# Patient Record
Sex: Female | Born: 1950 | Race: White | Hispanic: Yes | Marital: Married
Health system: Southern US, Community
[De-identification: ages and names within clinical notes are randomized; demographics above are authoritative.]

---

## 2020-01-04 ENCOUNTER — Emergency Department (HOSPITAL_COMMUNITY): Payer: 59

## 2020-01-04 ENCOUNTER — Inpatient Hospital Stay (HOSPITAL_COMMUNITY)
Admission: EM | Admit: 2020-01-04 | Discharge: 2020-01-17 | DRG: 813 | Disposition: E | Payer: 59 | Attending: Pulmonary Disease | Admitting: Pulmonary Disease

## 2020-01-04 DIAGNOSIS — E872 Acidosis: Secondary | ICD-10-CM | POA: Diagnosis present

## 2020-01-04 DIAGNOSIS — R64 Cachexia: Secondary | ICD-10-CM | POA: Diagnosis present

## 2020-01-04 DIAGNOSIS — N179 Acute kidney failure, unspecified: Secondary | ICD-10-CM | POA: Diagnosis present

## 2020-01-04 DIAGNOSIS — I468 Cardiac arrest due to other underlying condition: Secondary | ICD-10-CM | POA: Diagnosis present

## 2020-01-04 DIAGNOSIS — J9601 Acute respiratory failure with hypoxia: Secondary | ICD-10-CM | POA: Diagnosis present

## 2020-01-04 DIAGNOSIS — K7200 Acute and subacute hepatic failure without coma: Secondary | ICD-10-CM | POA: Diagnosis present

## 2020-01-04 DIAGNOSIS — I248 Other forms of acute ischemic heart disease: Secondary | ICD-10-CM | POA: Diagnosis present

## 2020-01-04 DIAGNOSIS — Z66 Do not resuscitate: Secondary | ICD-10-CM | POA: Diagnosis not present

## 2020-01-04 DIAGNOSIS — I509 Heart failure, unspecified: Secondary | ICD-10-CM | POA: Diagnosis present

## 2020-01-04 DIAGNOSIS — J9600 Acute respiratory failure, unspecified whether with hypoxia or hypercapnia: Secondary | ICD-10-CM

## 2020-01-04 DIAGNOSIS — I469 Cardiac arrest, cause unspecified: Secondary | ICD-10-CM | POA: Diagnosis present

## 2020-01-04 DIAGNOSIS — Z681 Body mass index (BMI) 19 or less, adult: Secondary | ICD-10-CM | POA: Diagnosis not present

## 2020-01-04 DIAGNOSIS — R634 Abnormal weight loss: Secondary | ICD-10-CM | POA: Diagnosis present

## 2020-01-04 DIAGNOSIS — I429 Cardiomyopathy, unspecified: Secondary | ICD-10-CM | POA: Diagnosis present

## 2020-01-04 DIAGNOSIS — R0489 Hemorrhage from other sites in respiratory passages: Secondary | ICD-10-CM | POA: Diagnosis present

## 2020-01-04 DIAGNOSIS — Z20822 Contact with and (suspected) exposure to covid-19: Secondary | ICD-10-CM | POA: Diagnosis present

## 2020-01-04 DIAGNOSIS — K920 Hematemesis: Secondary | ICD-10-CM | POA: Diagnosis present

## 2020-01-04 DIAGNOSIS — D62 Acute posthemorrhagic anemia: Secondary | ICD-10-CM | POA: Diagnosis present

## 2020-01-04 DIAGNOSIS — K922 Gastrointestinal hemorrhage, unspecified: Secondary | ICD-10-CM | POA: Diagnosis present

## 2020-01-04 DIAGNOSIS — K921 Melena: Secondary | ICD-10-CM | POA: Diagnosis present

## 2020-01-04 DIAGNOSIS — D61818 Other pancytopenia: Secondary | ICD-10-CM | POA: Diagnosis present

## 2020-01-04 DIAGNOSIS — R578 Other shock: Secondary | ICD-10-CM | POA: Diagnosis present

## 2020-01-04 DIAGNOSIS — D65 Disseminated intravascular coagulation [defibrination syndrome]: Secondary | ICD-10-CM | POA: Diagnosis present

## 2020-01-04 DIAGNOSIS — Z515 Encounter for palliative care: Secondary | ICD-10-CM | POA: Diagnosis not present

## 2020-01-04 DIAGNOSIS — D696 Thrombocytopenia, unspecified: Secondary | ICD-10-CM | POA: Diagnosis present

## 2020-01-04 DIAGNOSIS — B20 Human immunodeficiency virus [HIV] disease: Secondary | ICD-10-CM | POA: Diagnosis present

## 2020-01-04 DIAGNOSIS — Z9114 Patient's other noncompliance with medication regimen: Secondary | ICD-10-CM | POA: Diagnosis not present

## 2020-01-04 DIAGNOSIS — E875 Hyperkalemia: Secondary | ICD-10-CM | POA: Diagnosis present

## 2020-01-04 DIAGNOSIS — R579 Shock, unspecified: Secondary | ICD-10-CM

## 2020-01-04 DIAGNOSIS — R680 Hypothermia, not associated with low environmental temperature: Secondary | ICD-10-CM | POA: Diagnosis present

## 2020-01-04 LAB — BPAM PLATELET PHERESIS
Blood Product Expiration Date: 202104192359
ISSUE DATE / TIME: 202104180941
Unit Type and Rh: 6200

## 2020-01-04 LAB — RAPID URINE DRUG SCREEN, HOSP PERFORMED
Amphetamines: NOT DETECTED
Barbiturates: NOT DETECTED
Benzodiazepines: NOT DETECTED
Cocaine: NOT DETECTED
Opiates: NOT DETECTED
Tetrahydrocannabinol: NOT DETECTED

## 2020-01-04 LAB — COMPREHENSIVE METABOLIC PANEL
ALT: 220 U/L — ABNORMAL HIGH (ref 0–44)
AST: 358 U/L — ABNORMAL HIGH (ref 15–41)
Albumin: 1.5 g/dL — ABNORMAL LOW (ref 3.5–5.0)
Alkaline Phosphatase: 35 U/L — ABNORMAL LOW (ref 38–126)
Anion gap: 18 — ABNORMAL HIGH (ref 5–15)
BUN: 33 mg/dL — ABNORMAL HIGH (ref 8–23)
CO2: 8 mmol/L — ABNORMAL LOW (ref 22–32)
Calcium: 6.9 mg/dL — ABNORMAL LOW (ref 8.9–10.3)
Chloride: 110 mmol/L (ref 98–111)
Creatinine, Ser: 1.41 mg/dL — ABNORMAL HIGH (ref 0.44–1.00)
GFR calc Af Amer: 44 mL/min — ABNORMAL LOW (ref >60)
GFR calc non Af Amer: 38 mL/min — ABNORMAL LOW (ref >60)
Glucose, Bld: 307 mg/dL — ABNORMAL HIGH (ref 70–99)
Potassium: 5.3 mmol/L — ABNORMAL HIGH (ref 3.5–5.1)
Sodium: 136 mmol/L (ref 135–145)
Total Bilirubin: 0.6 mg/dL (ref 0.3–1.2)
Total Protein: 3.3 g/dL — ABNORMAL LOW (ref 6.5–8.1)

## 2020-01-04 LAB — CBC WITH DIFFERENTIAL/PLATELET
Abs Immature Granulocytes: 0.08 10*3/uL — ABNORMAL HIGH (ref 0.00–0.07)
Basophils Absolute: 0 10*3/uL (ref 0.0–0.1)
Basophils Relative: 0 %
Eosinophils Absolute: 0 10*3/uL (ref 0.0–0.5)
Eosinophils Relative: 0 %
HCT: 12 % — ABNORMAL LOW (ref 36.0–46.0)
Hemoglobin: 3.4 g/dL — CL (ref 12.0–15.0)
Immature Granulocytes: 3 %
Lymphocytes Relative: 71 %
Lymphs Abs: 1.7 10*3/uL (ref 0.7–4.0)
MCH: 27 pg (ref 26.0–34.0)
MCHC: 28.3 g/dL — ABNORMAL LOW (ref 30.0–36.0)
MCV: 95.2 fL (ref 80.0–100.0)
Monocytes Absolute: 0.1 10*3/uL (ref 0.1–1.0)
Monocytes Relative: 3 %
Neutro Abs: 0.5 10*3/uL — ABNORMAL LOW (ref 1.7–7.7)
Neutrophils Relative %: 23 %
Platelets: 12 10*3/uL — CL (ref 150–400)
RBC: 1.26 MIL/uL — ABNORMAL LOW (ref 3.87–5.11)
RDW: 16.8 % — ABNORMAL HIGH (ref 11.5–15.5)
WBC: 2.4 10*3/uL — ABNORMAL LOW (ref 4.0–10.5)
nRBC: 17.4 % — ABNORMAL HIGH (ref 0.0–0.2)

## 2020-01-04 LAB — URINALYSIS, ROUTINE W REFLEX MICROSCOPIC
Bacteria, UA: NONE SEEN
Bilirubin Urine: NEGATIVE
Glucose, UA: NEGATIVE mg/dL
Hgb urine dipstick: NEGATIVE
Ketones, ur: NEGATIVE mg/dL
Leukocytes,Ua: NEGATIVE
Nitrite: NEGATIVE
Protein, ur: NEGATIVE mg/dL
Specific Gravity, Urine: 1.02 (ref 1.005–1.030)
pH: 5 (ref 5.0–8.0)

## 2020-01-04 LAB — PREPARE PLATELET PHERESIS: Unit division: 0

## 2020-01-04 LAB — RESPIRATORY PANEL BY RT PCR (FLU A&B, COVID)
Influenza A by PCR: NEGATIVE
Influenza B by PCR: NEGATIVE
SARS Coronavirus 2 by RT PCR: NEGATIVE

## 2020-01-04 LAB — CBG MONITORING, ED: Glucose-Capillary: 264 mg/dL — ABNORMAL HIGH (ref 70–99)

## 2020-01-04 LAB — PROTIME-INR
INR: 3.5 — ABNORMAL HIGH (ref 0.8–1.2)
Prothrombin Time: 35.3 seconds — ABNORMAL HIGH (ref 11.4–15.2)

## 2020-01-04 LAB — POC OCCULT BLOOD, ED: Fecal Occult Bld: POSITIVE — AB

## 2020-01-04 LAB — MASSIVE TRANSFUSION PROTOCOL ORDER (BLOOD BANK NOTIFICATION)

## 2020-01-04 LAB — BRAIN NATRIURETIC PEPTIDE: B Natriuretic Peptide: 1453.4 pg/mL — ABNORMAL HIGH (ref 0.0–100.0)

## 2020-01-04 LAB — PREPARE RBC (CROSSMATCH)

## 2020-01-04 LAB — LACTIC ACID, PLASMA: Lactic Acid, Venous: >11 mmol/L (ref 0.5–1.9)

## 2020-01-04 LAB — TROPONIN I (HIGH SENSITIVITY)
Troponin I (High Sensitivity): 4723 ng/L (ref <18)
Troponin I (High Sensitivity): 5871 ng/L (ref <18)

## 2020-01-04 LAB — ABO/RH: ABO/RH(D): O POS

## 2020-01-04 MED ORDER — FENTANYL CITRATE (PF) 100 MCG/2ML IJ SOLN: 50.0000 ug | INTRAMUSCULAR | Status: DC | PRN

## 2020-01-04 MED ORDER — DOCUSATE SODIUM 100 MG PO CAPS: 100.0000 mg | ORAL_CAPSULE | Freq: Two times a day (BID) | ORAL | Status: DC | PRN

## 2020-01-04 MED ORDER — PANTOPRAZOLE SODIUM 40 MG IV SOLR: 40.0000 mg | Freq: Two times a day (BID) | INTRAVENOUS | Status: DC

## 2020-01-04 MED ORDER — LACTATED RINGERS IV SOLN: INTRAVENOUS | Status: DC

## 2020-01-04 MED ORDER — LACTATED RINGERS IV BOLUS: 1000.0000 mL | Freq: Once | INTRAVENOUS | Status: DC

## 2020-01-04 MED ORDER — SODIUM CHLORIDE 0.9 % IV SOLN: 10.0000 mL/h | Freq: Once | INTRAVENOUS | Status: DC

## 2020-01-04 MED ORDER — SODIUM CHLORIDE 0.9% IV SOLUTION: Freq: Once | INTRAVENOUS | Status: DC

## 2020-01-04 MED ORDER — MORPHINE 100MG IN NS 100ML (1MG/ML) PREMIX INFUSION
0.0000 mg/h | INTRAVENOUS | Status: DC
  Administered 2020-01-04: 1 mg/h via INTRAVENOUS
  Filled 2020-01-04: qty 100

## 2020-01-04 MED ORDER — NOREPINEPHRINE 4 MG/250ML-% IV SOLN: 0.0000 ug/min | INTRAVENOUS | Status: DC

## 2020-01-04 MED ORDER — POLYETHYLENE GLYCOL 3350 17 G PO PACK: 17.0000 g | PACK | Freq: Every day | ORAL | Status: DC | PRN

## 2020-01-04 MED ORDER — VITAMIN K1 10 MG/ML IJ SOLN
10.0000 mg | Freq: Once | INTRAVENOUS | Status: DC
  Filled 2020-01-04: qty 1

## 2020-01-04 MED ORDER — NOREPINEPHRINE 4 MG/250ML-% IV SOLN
2.0000 ug/min | INTRAVENOUS | Status: DC
  Administered 2020-01-04: 08:00:00 10 ug/min via INTRAVENOUS
  Filled 2020-01-04: qty 250

## 2020-01-04 MED ORDER — FENTANYL CITRATE (PF) 100 MCG/2ML IJ SOLN: 25.0000 ug | INTRAMUSCULAR | Status: DC | PRN

## 2020-01-04 MED ORDER — SODIUM CHLORIDE 0.9 % IV SOLN
250.0000 mL | INTRAVENOUS | Status: DC
  Administered 2020-01-04: 08:00:00 250 mL via INTRAVENOUS

## 2020-01-04 MED ORDER — MIDAZOLAM HCL 2 MG/2ML IJ SOLN: 1.0000 mg | INTRAMUSCULAR | Status: DC | PRN

## 2020-01-05 LAB — BPAM RBC
Blood Product Expiration Date: 202105082359
Blood Product Expiration Date: 202105132359
Blood Product Expiration Date: 202105132359
Blood Product Expiration Date: 202105132359
Blood Product Expiration Date: 202105142359
Blood Product Expiration Date: 202105142359
ISSUE DATE / TIME: 202104180743
ISSUE DATE / TIME: 202104180905
ISSUE DATE / TIME: 202104180941
ISSUE DATE / TIME: 202104180941
ISSUE DATE / TIME: 202104180948
ISSUE DATE / TIME: 202104180948
Unit Type and Rh: 5100
Unit Type and Rh: 5100
Unit Type and Rh: 5100
Unit Type and Rh: 5100
Unit Type and Rh: 5100
Unit Type and Rh: 5100

## 2020-01-05 LAB — TYPE AND SCREEN
ABO/RH(D): O POS
Antibody Screen: NEGATIVE
Unit division: 0
Unit division: 0
Unit division: 0
Unit division: 0
Unit division: 0
Unit division: 0

## 2020-01-05 LAB — PREPARE FRESH FROZEN PLASMA
Unit division: 0
Unit division: 0
Unit division: 0
Unit division: 0
Unit division: 0

## 2020-01-05 LAB — BPAM FFP
Blood Product Expiration Date: 202104182359
Blood Product Expiration Date: 202104232359
Blood Product Expiration Date: 202104232359
Blood Product Expiration Date: 202104232359
Blood Product Expiration Date: 202104232359
Blood Product Expiration Date: 202105012359
Blood Product Expiration Date: 202105012359
Blood Product Expiration Date: 202105012359
ISSUE DATE / TIME: 202104180938
ISSUE DATE / TIME: 202104180938
ISSUE DATE / TIME: 202104180948
ISSUE DATE / TIME: 202104180948
Unit Type and Rh: 5100
Unit Type and Rh: 5100
Unit Type and Rh: 5100
Unit Type and Rh: 6200
Unit Type and Rh: 6200
Unit Type and Rh: 6200
Unit Type and Rh: 6200
Unit Type and Rh: 6200

## 2020-01-05 LAB — T-HELPER CELLS (CD4) COUNT (NOT AT ARMC)
CD4 % Helper T Cell: 7 % — ABNORMAL LOW (ref 33–65)
CD4 T Cell Abs: 119 /uL — ABNORMAL LOW (ref 400–1790)

## 2020-01-05 LAB — URINE CULTURE: Culture: NO GROWTH

## 2020-01-05 LAB — PATHOLOGIST SMEAR REVIEW

## 2020-01-07 DIAGNOSIS — R0489 Hemorrhage from other sites in respiratory passages: Secondary | ICD-10-CM | POA: Diagnosis present

## 2020-01-07 DIAGNOSIS — B20 Human immunodeficiency virus [HIV] disease: Secondary | ICD-10-CM | POA: Diagnosis present

## 2020-01-07 DIAGNOSIS — J9601 Acute respiratory failure with hypoxia: Secondary | ICD-10-CM | POA: Diagnosis present

## 2020-01-07 DIAGNOSIS — D696 Thrombocytopenia, unspecified: Secondary | ICD-10-CM | POA: Diagnosis present

## 2020-01-07 DIAGNOSIS — K922 Gastrointestinal hemorrhage, unspecified: Secondary | ICD-10-CM | POA: Diagnosis present

## 2020-01-07 DIAGNOSIS — N179 Acute kidney failure, unspecified: Secondary | ICD-10-CM | POA: Diagnosis present

## 2020-01-07 DIAGNOSIS — D65 Disseminated intravascular coagulation [defibrination syndrome]: Secondary | ICD-10-CM | POA: Diagnosis present

## 2020-01-17 NOTE — Progress Notes (Signed)
75 mL morphine drip (1 mg per mL) wasted with Allene Pyo, RN.

## 2020-01-17 NOTE — Progress Notes (Signed)
Chaplain responded to page from nurse. Patient deceased.  Chaplain offered support and prayer for patient, and  patient's daughter, Heather Schroeder.  Chaplain conveyed Patient Placement Card. No funeral home yet chosen.  [facility].  Heather Schroeder, [address], [place], [place]  [address]  [phone]  Chaplain escorted daughter to door. Rev. Lynnell Chad [phone]

## 2020-01-17 NOTE — Progress Notes (Signed)
PCCM attending attestation:  Full H&P to follow.  By resident housestaff.  This is a 69 year old female recently relocated from [place].  Patient found at home after complaining of abdominal pain and cardiac arrest.  EMS performed CPR for approximately 15 minutes in PEA.  Patient found to have an undetectable low hemoglobin.  Undergoing acute upper GI bleeding.  Patient was intubated on mechanical life support profuse amount of bleeding.  Unfortunately with ongoing severe coagulopathy.  Initial hemoglobin of 3.4 platelets of 12 INR of 3.5.  Of note past medical history of HIV stopped taking medications 2 years ago with greater than 70 pound weight loss.  Also history of cardiomyopathy.  Patient severely cachectic with ongoing hemorrhagic shock hypothermia core body temperature of less than 92.  Now in multiorgan failure and what appears to be DIC.  She is bleeding from almost every orifice.  She appears to have diffuse spontaneous alveolar hemorrhage with significant amount of blood returning from ET tube.  BP (!) 115/95   Pulse (!) 106   Temp (!) 92.9 F (33.8 C)   Resp (!) 22   Ht 5\' 4"  (1.626 m)   Wt 49.9 kg   SpO2 100%   BMI 18.88 kg/m   General: Elderly chronically ill-appearing female intubated on mechanical life support, cyanotic Heart: Tachycardic, regular, S1-S2 Lungs: Diffuse severe rhonchi agonal respirations on vent Abdomen: distended Extremities: Cold, cyanotic Skin: Mottled  Labs: Reviewed Chest x-ray: Reviewed  Assessment: Acute hemorrhagic shock Consumptive coagulopathy Hypothermia, acidosis Pulmonary hemorrhage Presumed DIC Post cardiac arrest Hyperkalemia Acute renal failure Elevated troponin likely related to demand ischemia Severe lactic acidosis Hypocalcemia HIV, noncompliant w/ meds  70lbs weightloss   Plan: Long discussion with patient's daughter at bedside. She states that "mom never would have wanted this" At this point I believe the patient is  actively dying. Her vital signs are not compatible with life.  O2 sats in the 50s on vent support. We discussed appropriate next measures with patient's daughter. We will plan to admit to the intensive care unit we have the patient on mechanical support with OG tube as she is having profuse amounts of bleeding from the upper airway and stomach. We will plan to transition to full comfort measures. Starting patient on morphine infusion, as needed Versed Continue IV fluids No additional blood products. Patient's CODE STATUS changed to full DNR. Currently on vasopressors that we will need to titrate off.  This patient is critically ill with multiple organ system failure; which, requires frequent high complexity decision making, assessment, support, evaluation, and titration of therapies. This was completed through the application of advanced monitoring technologies and extensive interpretation of multiple databases. During this encounter critical care time was devoted to patient care services described in this note for 42 minutes.   , DO [facility] 08-Jan-2020 10:37 AM

## 2020-01-17 NOTE — Progress Notes (Signed)
Patient asystole on monitor. No audible heart tones. No breath sounds. No palpable.   Time of death confirmed at 1543 by Allene Pyo, RN and Delanna Ahmadi, RN.   Chaplin services offered. No funeral home arrangements known. Family provided with information when funeral home decided.  Hand prints and poems given to family.

## 2020-01-17 NOTE — Plan of Care (Signed)
  Problem: Clinical Measurements: Goal: Respiratory complications will improve Outcome: Not Progressing Note: Pt on ventilator requiring 100% FiO2.   Goal: Cardiovascular complication will be avoided Outcome: Not Progressing Note: Pt post arrest, HR in low 100's, BP steadily decreasing

## 2020-01-17 NOTE — ED Triage Notes (Addendum)
Pt in from home, found down by family, CPR started by family, in via [facility] as post-CPR with 15 min CPR PTA. Initial rhythm asystole, 2 epi's given, then PEA, then pulses at 0626. Palpated pressure of 80 en route, airway in place, 16G to [facility]

## 2020-01-17 NOTE — H&P (Addendum)
NAME:  Heather Schroeder, MRN:  [id number], DOB:  13-Jul-1951, LOS: 0 ADMISSION DATE:  01-30-20, CONSULTATION DATE:  01-30-2020 REFERRING MD:  Dr. Regenia Skeeter, CHIEF COMPLAINT:  Cardiac arrest   Brief History   69 year old female with a medical history of uncontrolled HIV and CHF who presented to the ED post cardiac arrest currently admitted for hemorrhagic shock with multi-organ failure.   History of present illness   69 year old female with a medical history of uncontrolled HIV and CHF who presented to the ED post cardiac arrest. Patient lives in [place] and moved to [place] on Friday. Per daughter, she has been complaining of abdominal pain x 2 days. This has been associated with coffee-ground emesis. They have not noticed any hematochezia or melena. This morning patient was feeling weak. She went to lay in the couch, had an episode of urinary incontinence and then sopped breathing. Daughter called 71 and CPR was started right away, though she does not think she was doing it correctly. When EMS arrived patient was pulseless and in asystole. CPR performed x 15-20 minutes. She received 2 epis and was intubated prior to ROSC.   Per daughter, patient has not been taking care of herself. She stopped taking her HIV medications 2 yrs ago and has had a 70 lbs weight loss in the past 6 months.  She was recently in the hospital in [place]. Daughter is not sure why, but states there were abnormalities in her chest imaging and she underwent testing for TB, which was negative. No records available at this time. At that time, she was also found to have a clot in her heart and she was discharged on Eliquis.   In the ED she was hypothermic 96.1, HR 86, BP 69/53 and O2 sat 100 % on the vent (FiO2 50, PEEP 5, TV 420 and RR 18). Her bloodwork was remarkable for WBC 2.4, Hgb 3.4, Plt 12, K 5.3, bicarb 8, glucose 80, BUN/Cr 33/1.41, AST 358, ALT pending   Past Medical History  HIV - uncontrolled, off of meds x 2 yrs  CHF - unclear  if diastolic or systolic  Cardiac thrombus   Significant Hospital Events   4/18 Admitted to the ICU, intubated   Consults:  None  Procedures:  4/18 ETT, femoral line   Significant Diagnostic Tests:  4/18 CXR   Micro Data:  4/18 Ucx 4/18 Bcx   Antimicrobials:  None  Interim history/subjective:  Please see above.   Objective   Blood pressure (!) 82/56, pulse 99, temperature (!) 96.1 F (35.6 C), temperature source Temporal, resp. rate 19, height 5\' 4"  (1.626 m), weight 49.9 kg, SpO2 100 %.    Vent Mode: PRVC FiO2 (%):  [100 %] 100 % Set Rate:  [18 bmp] 18 bmp Vt Set:  [430 mL] 430 mL PEEP:  [5 cmH20] 5 cmH20 Plateau Pressure:  [4 cmH20-12 cmH20] 12 cmH20   Intake/Output Summary (Last 24 hours) at 01/30/20 0911 Last data filed at 01/30/20 0835 Gross per 24 hour  Intake 335 ml  Output --  Net 335 ml   Filed Weights   2020/01/30 0821  Weight: 49.9 kg   Physical Exam Vitals and nursing note reviewed.  Constitutional:      General: She is in acute distress.     Appearance: She is ill-appearing and toxic-appearing.  HENT:     Head: Normocephalic and atraumatic.     Mouth/Throat:     Comments: Blood noted in oropharynx  Eyes:     General: No scleral icterus.    Pupils:     Right eye: Pupil is not reactive.     Left eye: Pupil is not reactive.  Cardiovascular:     Rate and Rhythm: Regular rhythm. Tachycardia present.  Pulmonary:     Breath sounds: Rhonchi present.  Abdominal:     General: Bowel sounds are normal. There is no distension.     Palpations: Abdomen is soft.     Tenderness: There is no abdominal tenderness. There is no guarding.  Musculoskeletal:        General: No deformity.     Right lower leg: No edema.     Left lower leg: No edema.  Skin:    General: Skin is cool and dry.  Neurological:     Comments: Sedated and unresponsive on vent    On re-examination, oxygen saturation 50% on ventilator. She is having markedly bright red blood per  rectum. Bright red blood is also noted to be in her ETT and coming out of oropharynx.   Resolved Hospital Problem list   None   Assessment & Plan:   ** After discussion with daughter at bedside and another daughter via telephone, it was decided to transition patient to DNR and de-escalate interventions. Will try to extend life until tonight when additional family members arrive into town.   # s/p Cardiac arrest Likely due to profound anemia. Initially asystole with conversion to PEA, received 2 doses of Epi, no shocks. Initial troponin is expectedly elevated in the 4000s.   # Shock with multi-organ failure, hemorrhagic # Upper GI Bleed Significant GI bleed with profound hematemesis and melena on examination. S/p 4 units of pRBCs, 1 unit of platelets, 1 unit of FFP. Will stop further transfusions and sustain intravascular volume with LR as best as possible.   Prognosis is grime given patient's coagulopathy and hypothermia, making bleed difficult to control. At this point, she is bleeding faster than it can be replenished. She is additionally developed pulmonary hemorrhaging, as evidence by blood in the ETT.   - Titrate Levophed to maintain MAP > 65  - LR bolus followed by 150 cc/hr  # Pancytopenia  Profound with normocytic anemia with hemoglobin of 3.4 on arrival, in addition to thrombocytopenia with platelets of 12. Leukopenia is likely chronic in light of HIV status. However, anemia and thrombocytopenia is acute. Anemia secondary to GI bleed. There may be an aspect of hemolysis versus DIC affecting platelets given her diffuse hemorrhaging and elevated INR.   # Acute respiratory failure  - Continue PRVC @ 8 cc/kg   # Acute renal failure  # Hyperkalemia  Secondary to shock  # Acute liver failure  Secondary to shock  - s/p Vitamin K 10 mg  # HIV Uncontrolled as patient has not taken medications in 2 years. No other history available at this time.   # Unintentional Weight loss 70  pounds over the last 6 months. Likely malignancy. Unknown primary at this time. All work up has been in [place] and records not available.   Best practice:  Diet: NPO Pain/Anxiety/Delirium protocol (if indicated): Morphine gtt, Versed PRN, Fentanyl PRn VAP protocol (if indicated): Ordered DVT prophylaxis: Not ordered, active GI bleed GI prophylaxis: Protonix BID Glucose control: None  Mobility: Bedbound Code Status: DNR Family Communication: Updated daughter at bedside Disposition: ICU  Labs   CBC: Recent Labs  Lab January 10, 2020 0718  WBC 2.4*  NEUTROABS PENDING  HGB 3.4*  HCT  12.0*  MCV 95.2  PLT PENDING    Basic Metabolic Panel: Recent Labs  Lab 01-10-20 0718  NA 136  K 5.3*  CL 110  CO2 8*  GLUCOSE 307*  BUN 33*  CREATININE 1.41*  CALCIUM 6.9*   GFR: Estimated Creatinine Clearance: 30.1 mL/min (A) (by C-G formula based on SCr of 1.41 mg/dL (H)). Recent Labs  Lab 01/10/2020 0718  WBC 2.4*  LATICACIDVEN >11.0*    Liver Function Tests: Recent Labs  Lab 01/10/20 0718  AST 358*  ALT PENDING  ALKPHOS 35*  BILITOT 0.6  PROT 3.3*  ALBUMIN 1.5*   No results for input(s): LIPASE, AMYLASE in the last 168 hours. No results for input(s): AMMONIA in the last 168 hours.  ABG No results found for: PHART, PCO2ART, PO2ART, HCO3, TCO2, ACIDBASEDEF, O2SAT   Coagulation Profile: Recent Labs  Lab 01-10-20 0718  INR 3.5*    Cardiac Enzymes: No results for input(s): CKTOTAL, CKMB, CKMBINDEX, TROPONINI in the last 168 hours.  HbA1C: No results found for: HGBA1C  CBG: Recent Labs  Lab 01-10-2020 0719  GLUCAP 264*    Review of Systems:   Negative, except as noted above.   Past Medical History  She,  has no past medical history on file.   Surgical History   Unable to obtain due to AMS  Social History    Unable to obtain due to AMS  Family History   Her family history is not on file.   Allergies Not on File   Home Medications  Prior to Admission  medications   Not on File       Dr. Verdene Lennert Internal Medicine PGY-1  Pager: [phone] Jan 10, 2020, 9:11 AM   PCCM:  Please see previous note for attestation.  Admit to ICU for comfort care transition.   Josephine Igo, DO [facility] 01/10/2020 11:22 AM

## 2020-01-17 NOTE — ED Notes (Addendum)
1U Platelets infused 1000-1002

## 2020-01-17 NOTE — ED Provider Notes (Signed)
[facility]   CSN: [id number] Arrival date & time: 05/04/20  [id number]  LEVEL 5 CAVEAT - UNRESPONSIVE History Chief Complaint  Patient presents with  . Post CPR    Heather Schroeder is a 69 y.o. female.  HPI 69 year old female presents after CPR.  History is very limited at this time as the patient is unresponsive, no family is available, and history is only via EMS.  Patient reportedly became unresponsive in front of family and they lowered her to the floor.  They started CPR and then EMS did CPR for 15 minutes.  Original rhythm was asystole.  Then developed sinus tach and a possible STEMI and code STEMI was called.  Since then has had some mild hypotension of around 85 systolic.  She was resisting tube and so had to be given benzodiazepine. 2 epinephrines given.  No past medical history on file.  Patient Active Problem List   Diagnosis Date Noted  . Cardiac arrest (HCC) 2020-04-01       OB History   No obstetric history on file.     No family history on file.  Social History   Tobacco Use  . Smoking status: Not on file  Substance Use Topics  . Alcohol use: Not on file  . Drug use: Not on file    Home Medications Prior to Admission medications   Medication Sig Start Date End Date Taking? Authorizing Provider  apixaban (ELIQUIS) 5 MG TABS tablet Take 5 mg by mouth 2 (two) times daily.   Yes [provider]  aspirin EC 81 MG tablet Take 81 mg by mouth daily.   Yes [provider]  Dolutegravir-Rilpivirine (JULUCA) 50-25 MG TABS Take 1 tablet by mouth daily.   Yes [provider]  esomeprazole (NEXIUM) 40 MG capsule Take 40 mg by mouth daily at 12 noon.   Yes [provider]  magic mouthwash SOLN Take 5 mLs by mouth 3 (three) times daily as needed for mouth pain.   Yes [provider]  pravastatin (PRAVACHOL) 40 MG tablet Take 40 mg by mouth daily.   Yes [provider]    sulfamethoxazole-trimethoprim (BACTRIM DS) 800-160 MG tablet Take 1 tablet by mouth daily.   Yes [provider]  Tenofovir Alafenamide Fumarate (VEMLIDY) 25 MG TABS Take 25 mg by mouth daily.   Yes [provider]    Allergies    Penicillins  Review of Systems   Review of Systems  Unable to perform ROS: Patient unresponsive    Physical Exam Updated Vital Signs BP (!) 115/95   Pulse (!) 106   Temp (!) 92.9 F (33.8 C)   Resp (!) 22   Ht 5\' 4"  (1.626 m)   Wt 49.9 kg   SpO2 100%   BMI 18.88 kg/m   Physical Exam Vitals and nursing note reviewed.  Constitutional:      Appearance: She is well-developed and underweight.     Interventions: She is intubated.  HENT:     Head: Normocephalic and atraumatic.     Right Ear: External ear normal.     Left Ear: External ear normal.     Nose: Nose normal.  Eyes:     General:        Right eye: No discharge.        Left eye: No discharge.  Cardiovascular:     Rate and Rhythm: Normal rate and regular rhythm.  Heart sounds: Normal heart sounds.  Pulmonary:     Effort: She is intubated.  Abdominal:     General: There is no distension.     Palpations: Abdomen is soft.  Genitourinary:    Comments: Dark blood on digital rectal exam. No obvious masses/hemorrhoids Skin:    General: Skin is warm and dry.  Neurological:     Mental Status: She is unresponsive.  Psychiatric:        Mood and Affect: Mood is not anxious.     ED Results / Procedures / Treatments   Labs (all labs ordered are listed, but only abnormal results are displayed) Labs Reviewed  COMPREHENSIVE METABOLIC PANEL - Abnormal; Notable for the following components:      Result Value   Potassium 5.3 (*)    CO2 8 (*)    Glucose, Bld 307 (*)    BUN 33 (*)    Creatinine, Ser 1.41 (*)    Calcium 6.9 (*)    Total Protein 3.3 (*)    Albumin 1.5 (*)    AST 358 (*)    ALT 220 (*)    Alkaline Phosphatase 35 (*)    GFR calc non Af Amer 38 (*)     GFR calc Af Amer 44 (*)    Anion gap 18 (*)    All other components within normal limits  LACTIC ACID, PLASMA - Abnormal; Notable for the following components:   Lactic Acid, Venous >11.0 (*)    All other components within normal limits  CBC WITH DIFFERENTIAL/PLATELET - Abnormal; Notable for the following components:   WBC 2.4 (*)    RBC 1.26 (*)    Hemoglobin 3.4 (*)    HCT 12.0 (*)    MCHC 28.3 (*)    RDW 16.8 (*)    Platelets 12 (*)    nRBC 17.4 (*)    Neutro Abs 0.5 (*)    Abs Immature Granulocytes 0.08 (*)    All other components within normal limits  PROTIME-INR - Abnormal; Notable for the following components:   Prothrombin Time 35.3 (*)    INR 3.5 (*)    All other components within normal limits  URINALYSIS, ROUTINE W REFLEX MICROSCOPIC - Abnormal; Notable for the following components:   APPearance TURBID (*)    All other components within normal limits  BRAIN NATRIURETIC PEPTIDE - Abnormal; Notable for the following components:   B Natriuretic Peptide 1,453.4 (*)    All other components within normal limits  CBG MONITORING, ED - Abnormal; Notable for the following components:   Glucose-Capillary 264 (*)    All other components within normal limits  POC OCCULT BLOOD, ED - Abnormal; Notable for the following components:   Fecal Occult Bld POSITIVE (*)    All other components within normal limits  TROPONIN I (HIGH SENSITIVITY) - Abnormal; Notable for the following components:   Troponin I (High Sensitivity) 4,723 (*)    All other components within normal limits  TROPONIN I (HIGH SENSITIVITY) - Abnormal; Notable for the following components:   Troponin I (High Sensitivity) 5,871 (*)    All other components within normal limits  RESPIRATORY PANEL BY RT PCR (FLU A&B, COVID)  CULTURE, BLOOD (ROUTINE X 2)  CULTURE, BLOOD (ROUTINE X 2)  URINE CULTURE  RAPID URINE DRUG SCREEN, HOSP PERFORMED  LACTIC ACID, PLASMA  T-HELPER CELLS (CD4) COUNT (NOT AT [facility])  I-STAT CHEM 8,  ED  I-STAT ARTERIAL BLOOD GAS, ED  TYPE AND SCREEN  ABO/RH  PREPARE RBC (CROSSMATCH)  PREPARE PLATELET PHERESIS  PREPARE FRESH FROZEN PLASMA  PREPARE RBC (CROSSMATCH)    EKG EKG Interpretation  Date/Time:  Sunday 01/16/2020 07:08:32 EDT Ventricular Rate:  110 PR Interval:    QRS Duration: 110 QT Interval:  356 QTC Calculation: 482 R Axis:   -71 Text Interpretation: Sinus tachycardia Ventricular premature complex Incomplete RBBB and LAFB Abnormal R-wave progression, late transition Nonspecific T abnormalities, lateral leads No old tracing to compare Confirmed by Pricilla Loveless [phone]) on 01/16/20 7:18:59 AM   Radiology DG Chest Portable 1 View  Result Date: 16-Jan-2020 CLINICAL DATA:  Status post CPR and intubation. EXAM: PORTABLE CHEST 1 VIEW COMPARISON:  Same day. FINDINGS: Stable cardiomediastinal silhouette. Endotracheal tube has been partially retracted with distal tip approximately 1 cm above the carina. Faint right upper lobe airspace opacity is noted. No pneumothorax or pleural effusion is noted. Bony thorax is unremarkable. IMPRESSION: Endotracheal tube has been partially retracted with distal tip approximately 1 cm above the carina. Faint right upper lobe airspace opacity is noted concerning for pneumonia. Follow-up radiographs are recommended. Electronically Signed   By: Lupita Raider M.D.   On: Jan 16, 2020 09:14   DG Chest Portable 1 View  Result Date: January 16, 2020 CLINICAL DATA:  Post CPR.  Unresponsive.  HIV positive. EXAM: PORTABLE CHEST 1 VIEW COMPARISON:  None. FINDINGS: Endotracheal tube enters the right mainstem bronchus and should be retracted approximately 4.0 cm. External pacer/defibrillator. Borderline cardiomegaly, accentuated by AP portable technique. No pleural effusion or pneumothorax. Possible right upper lobe airspace disease. Left lung base not well evaluated secondary to overlying support apparatus. IMPRESSION: Endotracheal tube entering right mainstem  bronchus. Recommend retraction 4.0 cm. Finding called to patient's nurse, Alycia Rossetti, at 8:19 a.m. Possible right upper lobe airspace disease at, as can be seen with aspiration or pneumonia. Electronically Signed   By: Jeronimo Greaves M.D.   On: 01/16/20 08:19    Procedures Procedure Name: Intubation Date/Time: Jan 16, 2020 9:04 AM Performed by: Pricilla Loveless, MD Pre-anesthesia Checklist: Patient identified, Patient being monitored, Emergency Drugs available, Timeout performed and Suction available Preoxygenation: Pre-oxygenation with 100% oxygen Ventilation: Mask ventilation without difficulty Laryngoscope Size: Glidescope and 3 Grade View: Grade II Tube size: 7.5 mm Number of attempts: 1 Airway Equipment and Method: Video-laryngoscopy Placement Confirmation: ETT inserted through vocal cords under direct vision,  CO2 detector and Breath sounds checked- equal and bilateral Secured at: 22 cm Tube secured with: ETT holder Dental Injury: Bloody posterior oropharynx  Difficulty Due To: Difficulty was unanticipated    .Central Line  Date/Time: Jan 16, 2020 9:05 AM Performed by: Pricilla Loveless, MD Authorized by: Pricilla Loveless, MD   Consent:    Consent obtained:  Emergent situation Pre-procedure details:    Hand hygiene: Hand hygiene performed prior to insertion     Sterile barrier technique: All elements of maximal sterile technique followed     Skin preparation:  ChloraPrep   Skin preparation agent: Skin preparation agent completely dried prior to procedure   Anesthesia (see MAR for exact dosages):    Anesthesia method:  None Procedure details:    Location:  R femoral   Site selection rationale:  On Eliquis   Patient position:  Flat   Procedural supplies:  Triple lumen   Landmarks identified: yes     Ultrasound guidance: yes     Number of attempts:  1   Successful placement: yes   Post-procedure details:    Post-procedure:  Dressing applied and line sutured   Assessment:  Blood  return through all ports and free fluid flow   Patient tolerance of procedure:  Tolerated well, no immediate complications ARTERIAL BLOOD GAS  Date/Time: 01-29-2020 9:06 AM Performed by: Pricilla Loveless, MD Authorized by: Pricilla Loveless, MD   Consent:    Consent obtained:  Emergent situation Procedure details:    Location:  R femoral   Number of attempts:  1 Post-procedure details:    Dressing applied: yes     Patient tolerance of procedure:  Tolerated well, no immediate complications .Critical Care Performed by: Pricilla Loveless, MD Authorized by: Pricilla Loveless, MD   Critical care provider statement:    Critical care time (minutes):  60   Critical care time was exclusive of:  Separately billable procedures and treating other patients   Critical care was necessary to treat or prevent imminent or life-threatening deterioration of the following conditions:  Circulatory failure and shock   Critical care was time spent personally by me on the following activities:  Discussions with consultants, evaluation of patient's response to treatment, examination of patient, ordering and performing treatments and interventions, ordering and review of laboratory studies, ordering and review of radiographic studies, pulse oximetry, re-evaluation of patient's condition, obtaining history from patient or surrogate and review of old charts   (including critical care time)  Medications Ordered in ED Medications  0.9 %  sodium chloride infusion (250 mLs Intravenous New Bag/Given 01/29/20 0820)  0.9 %  sodium chloride infusion (has no administration in time range)  0.9 %  sodium chloride infusion (Manually program via Guardrails IV Fluids) (has no administration in time range)  docusate sodium (COLACE) capsule 100 mg (has no administration in time range)  polyethylene glycol (MIRALAX / GLYCOLAX) packet 17 g (has no administration in time range)  pantoprazole (PROTONIX) injection 40 mg (has no  administration in time range)  norepinephrine (LEVOPHED) 4mg  in premix infusion (has no administration in time range)  fentaNYL (SUBLIMAZE) injection 25 mcg (has no administration in time range)  fentaNYL (SUBLIMAZE) injection 25-100 mcg (has no administration in time range)  midazolam (VERSED) injection 1 mg (has no administration in time range)  midazolam (VERSED) injection 1 mg (has no administration in time range)  phytonadione (VITAMIN K) 10 mg in dextrose 5 % 50 mL IVPB (has no administration in time range)  morphine 100mg  in NS (1mg /mL) infusion - premix (has no administration in time range)  lactated ringers bolus 1,000 mL (has no administration in time range)    Followed by  lactated ringers infusion (has no administration in time range)    ED Course  I have reviewed the triage vital signs and the nursing notes.  Pertinent labs & imaging results that were available during my care of the patient were reviewed by me and considered in my medical decision making (see chart for details).    MDM Rules/Calculators/A&P                      Discussed ECG with Dr. , who recommends canceling code STEMI called en route.  Patient is hypotensive shortly after arrival.  Was given fluid bolus and started on Levophed.  Central access obtained given only 1 peripheral IV and need for pressors.  Unfortunately I was unable to thread the catheter for the femoral art line.  However her blood pressure is coming up.  After multiple checks, she was found to have severe anemia.  At first it was possibly from the blood draw from the  IV placed by EMS but on a new blood draw this is clearly severe anemia.  She was found to likely be having GI bleeding with dark red blood on rectal exam.  Started on emergency release blood and then 2 units of crossmatched blood.  Discussed all of this with daughter who is in the hospital at the bedside.  ET tube placed by EMS was a 7.0 so this was switched out for a  7.5.  Difficulty passing it over a bougie and so she was intubated via glide scope.  Critical care consulted for admission.  Unfortunately she also started developing bleeding from other sites like in her ET tube.  Critical care has discussed with daughter and they have decided to withdraw care in the ICU.  Heather Schroeder was evaluated in Emergency Department on 2020-02-01 for the symptoms described in the history of present illness. She was evaluated in the context of the global COVID-19 pandemic, which necessitated consideration that the patient might be at risk for infection with the SARS-CoV-2 virus that causes COVID-19. Institutional protocols and algorithms that pertain to the evaluation of patients at risk for COVID-19 are in a state of rapid change based on information released by regulatory bodies including the CDC and federal and state organizations. These policies and algorithms were followed during the patient's care in the ED.  Final Clinical Impression(s) / ED Diagnoses Final diagnoses:  Cardiac arrest (HCC)  Acute respiratory failure, unspecified whether with hypoxia or hypercapnia (HCC)  Acute blood loss anemia  Shock (HCC)    Rx / DC Orders ED Discharge Orders    None       Pricilla Loveless, MD 02/01/2020 1049

## 2020-01-17 NOTE — ED Notes (Signed)
3rd unit MTP PRBC infused from [phone]

## 2020-01-17 NOTE — ED Notes (Signed)
1U FFP infused [id number]

## 2020-01-17 NOTE — ED Notes (Signed)
4th U PRBC infused [phone]

## 2020-01-17 NOTE — ED Notes (Signed)
flexi-seal placed

## 2020-01-17 NOTE — Death Summary Note (Signed)
DEATH SUMMARY   Patient Details  Name: Heather Schroeder MRN: [id number] DOB: [birth year]  Admission/Discharge Information   Admit Date:  2020-01-22  Date of Death: Date of Death: Jan 22, 2020  Time of Death: Time of Death: 01-20-1542  Length of Stay: 1  Referring Physician: System, Pcp Not In   Reason(s) for Hospitalization  This is a 69 year old female recently relocated from [place].  Patient found at home after complaining of abdominal pain and cardiac arrest.  EMS performed CPR for approximately 15 minutes in PEA.  Patient found to have an undetectable low hemoglobin.  Undergoing acute upper GI bleeding.  Patient was intubated on mechanical life support profuse amount of bleeding.  Unfortunately with ongoing severe coagulopathy.  Initial hemoglobin of 3.4 platelets of 12 INR of 3.5.  Of note past medical history of HIV stopped taking medications 2 years ago with greater than 70 pound weight loss.  Also history of cardiomyopathy.  Patient severely cachectic with ongoing hemorrhagic shock hypothermia core body temperature of less than 92.  Now in multiorgan failure and what appears to be DIC.  She is bleeding from almost every orifice.  She appears to have diffuse spontaneous alveolar hemorrhage with significant amount of blood returning from ET tube.  Diagnoses  Preliminary cause of death: Hemorrhagic shock (HCC) Secondary Diagnoses (including complications and co-morbidities):  Active Problems:   Cardiac arrest (HCC)   HIV (human immunodeficiency virus infection) (HCC)   UGIB (upper gastrointestinal bleed)   Acute renal failure (ARF) (HCC)   Acute hypoxemic respiratory failure (HCC)   Pulmonary alveolar hemorrhage   DIC (disseminated intravascular coagulation) (HCC)   Thrombocytopenia [facility])   Brief Hospital Course (including significant findings, care, treatment, and services provided and events leading to death)  Heather Schroeder is a 69 y.o. year old female who 69 year old female with a  medical history of uncontrolled HIV and CHF who presented to the ED post cardiac arrest. Patient lives in [place] and moved to [place] on Friday. Per daughter, she has been complaining of abdominal pain x 2 days. This has been associated with coffee-ground emesis. They have not noticed any hematochezia or melena. This morning patient was feeling weak. She went to lay in the couch, had an episode of urinary incontinence and then sopped breathing. Daughter called 911 and CPR was started right away, though she does not think she was doing it correctly. When EMS arrived patient was pulseless and in asystole. CPR performed x 15-20 minutes. She received 2 epis and was intubated prior to ROSC.   Per daughter, patient has not been taking care of herself. She stopped taking her HIV medications 2 yrs ago and has had a 70 lbs weight loss in the past 6 months.  She was recently in the hospital in [place]. Daughter is not sure why, but states there were abnormalities in her chest imaging and she underwent testing for TB, which was negative. No records available at this time. At that time, she was also found to have a clot in her heart and she was discharged on Eliquis.   In the ED she was hypothermic 96.1, HR 86, BP 69/53 and O2 sat 100 % on the vent (FiO2 50, PEEP 5, TV 420 and RR 18). Her bloodwork was remarkable for WBC 2.4, Hgb 3.4, Plt 12, K 5.3, bicarb 8, glucose 80, BUN/Cr 33/1.41, AST 358, ALT pending    Problems: Acute hemorrhagic shock Consumptive coagulopathy Hypothermia, acidosis Pulmonary hemorrhage Presumed DIC Post cardiac  arrest Hyperkalemia Acute renal failure Elevated troponin likely related to demand ischemia Severe lactic acidosis Hypocalcemia HIV, noncompliant w/ meds  70lbs weightloss   Plan: Long discussion with patient's daughter at bedside. She states that "mom never would have wanted this" At this point I believe the patient is actively dying. Her vital signs are not compatible with  life.  O2 sats in the 50s on vent support. We discussed appropriate next measures with patient's daughter. We will plan to admit to the intensive care unit we have the patient on mechanical support with OG tube as she is having profuse amounts of bleeding from the upper airway and stomach. We will plan to transition to full comfort measures. Starting patient on morphine infusion, as needed Versed Continue IV fluids No additional blood products. Patient's CODE STATUS changed to full DNR. Currently on vasopressors that we will need to titrate off.  Patient was transitioned to comfort care and passed peacefully with family at bedside.   Pertinent Labs and Studies  Significant Diagnostic Studies DG Chest Portable 1 View  Result Date: 01-20-20 CLINICAL DATA:  Status post CPR and intubation. EXAM: PORTABLE CHEST 1 VIEW COMPARISON:  Same day. FINDINGS: Stable cardiomediastinal silhouette. Endotracheal tube has been partially retracted with distal tip approximately 1 cm above the carina. Faint right upper lobe airspace opacity is noted. No pneumothorax or pleural effusion is noted. Bony thorax is unremarkable. IMPRESSION: Endotracheal tube has been partially retracted with distal tip approximately 1 cm above the carina. Faint right upper lobe airspace opacity is noted concerning for pneumonia. Follow-up radiographs are recommended. Electronically Signed   By: Marijo Conception M.D.   On: 01/20/20 09:14   DG Chest Portable 1 View  Result Date: 2020-01-20 CLINICAL DATA:  Post CPR.  Unresponsive.  HIV positive. EXAM: PORTABLE CHEST 1 VIEW COMPARISON:  None. FINDINGS: Endotracheal tube enters the right mainstem bronchus and should be retracted approximately 4.0 cm. External pacer/defibrillator. Borderline cardiomegaly, accentuated by AP portable technique. No pleural effusion or pneumothorax. Possible right upper lobe airspace disease. Left lung base not well evaluated secondary to overlying support  apparatus. IMPRESSION: Endotracheal tube entering right mainstem bronchus. Recommend retraction 4.0 cm. Finding called to patient's nurse, Thurmond Butts, at 8:19 a.m. Possible right upper lobe airspace disease at, as can be seen with aspiration or pneumonia. Electronically Signed   By: Abigail Miyamoto M.D.   On: 01/20/2020 08:19    Microbiology Recent Results (from the past 240 hour(s))  Culture, Urine     Status: None   Collection Time: 2020/01/20  8:56 AM   Specimen: Urine, Random  Result Value Ref Range Status   Specimen Description URINE, RANDOM  Final   Special Requests Immunocompromised  Final   Culture   Final    NO GROWTH Performed at [facility], [address], [place], [place] [address]    Report Status 01/05/2020 FINAL  Final  Respiratory Panel by RT PCR (Flu A&B, Covid) - Nasopharyngeal Swab     Status: None   Collection Time: 01-20-20  9:45 AM   Specimen: Nasopharyngeal Swab  Result Value Ref Range Status   SARS Coronavirus 2 by RT PCR NEGATIVE NEGATIVE Final    Comment: (NOTE) SARS-CoV-2 target nucleic acids are NOT DETECTED. The SARS-CoV-2 RNA is generally detectable in upper respiratoy specimens during the acute phase of infection. The lowest concentration of SARS-CoV-2 viral copies this assay can detect is 131 copies/mL. A negative result does not preclude SARS-Cov-2 infection and should not  be used as the sole basis for treatment or other patient management decisions. A negative result may occur with  improper specimen collection/handling, submission of specimen other than nasopharyngeal swab, presence of viral mutation(s) within the areas targeted by this assay, and inadequate number of viral copies (<131 copies/mL). A negative result must be combined with clinical observations, patient history, and epidemiological information. The expected result is Negative. Fact Sheet for Patients:  https://www.moore.com/ Fact Sheet for Healthcare Providers:   https://www.young.biz/ This test is not yet ap proved or cleared by the [place] FDA and  has been authorized for detection and/or diagnosis of SARS-CoV-2 by FDA under an Emergency Use Authorization (EUA). This EUA will remain  in effect (meaning this test can be used) for the duration of the COVID-19 declaration under Section 564(b)(1) of the Act, 21 U.S.C. section 360bbb-3(b)(1), unless the authorization is terminated or revoked sooner.    Influenza A by PCR NEGATIVE NEGATIVE Final   Influenza B by PCR NEGATIVE NEGATIVE Final    Comment: (NOTE) The Xpert Xpress SARS-CoV-2/FLU/RSV assay is intended as an aid in  the diagnosis of influenza from Nasopharyngeal swab specimens and  should not be used as a sole basis for treatment. Nasal washings and  aspirates are unacceptable for Xpert Xpress SARS-CoV-2/FLU/RSV  testing. Fact Sheet for Patients: https://www.moore.com/ Fact Sheet for Healthcare Providers: https://www.young.biz/ This test is not yet approved or cleared by the [place] FDA and  has been authorized for detection and/or diagnosis of SARS-CoV-2 by  FDA under an Emergency Use Authorization (EUA). This EUA will remain  in effect (meaning this test can be used) for the duration of the  Covid-19 declaration under Section 564(b)(1) of the Act, 21  U.S.C. section 360bbb-3(b)(1), unless the authorization is  terminated or revoked. Performed at [facility], [address], [place], [place] [address]     Lab Basic Metabolic Panel: Recent Labs  Lab 01-30-2020 0718  NA 136  K 5.3*  CL 110  CO2 8*  GLUCOSE 307*  BUN 33*  CREATININE 1.41*  CALCIUM 6.9*   Liver Function Tests: Recent Labs  Lab 01-30-2020 0718  AST 358*  ALT 220*  ALKPHOS 35*  BILITOT 0.6  PROT 3.3*  ALBUMIN 1.5*   No results for input(s): LIPASE, AMYLASE in the last 168 hours. No results for input(s): AMMONIA in the last 168  hours. CBC: Recent Labs  Lab Jan 30, 2020 0718  WBC 2.4*  NEUTROABS 0.5*  HGB 3.4*  HCT 12.0*  MCV 95.2  PLT 12*   Cardiac Enzymes: No results for input(s): CKTOTAL, CKMB, CKMBINDEX, TROPONINI in the last 168 hours. Sepsis Labs: Recent Labs  Lab 2020/01/30 0718  WBC 2.4*  LATICACIDVEN >11.0*    Procedures/Operations  ETT CVC   Kiyan Burmester L Shaunessy Dobratz 01/07/2020, 5:45 PM

## 2020-01-17 DEATH — deceased

## 2021-05-08 IMAGING — DX DG CHEST 1V PORT
1 series · 1 of 1 positions shown · non-contrast
Comparison: Same day.

CLINICAL DATA: Status post CPR and intubation.

EXAM:
PORTABLE CHEST 1 VIEW

[chest ap]
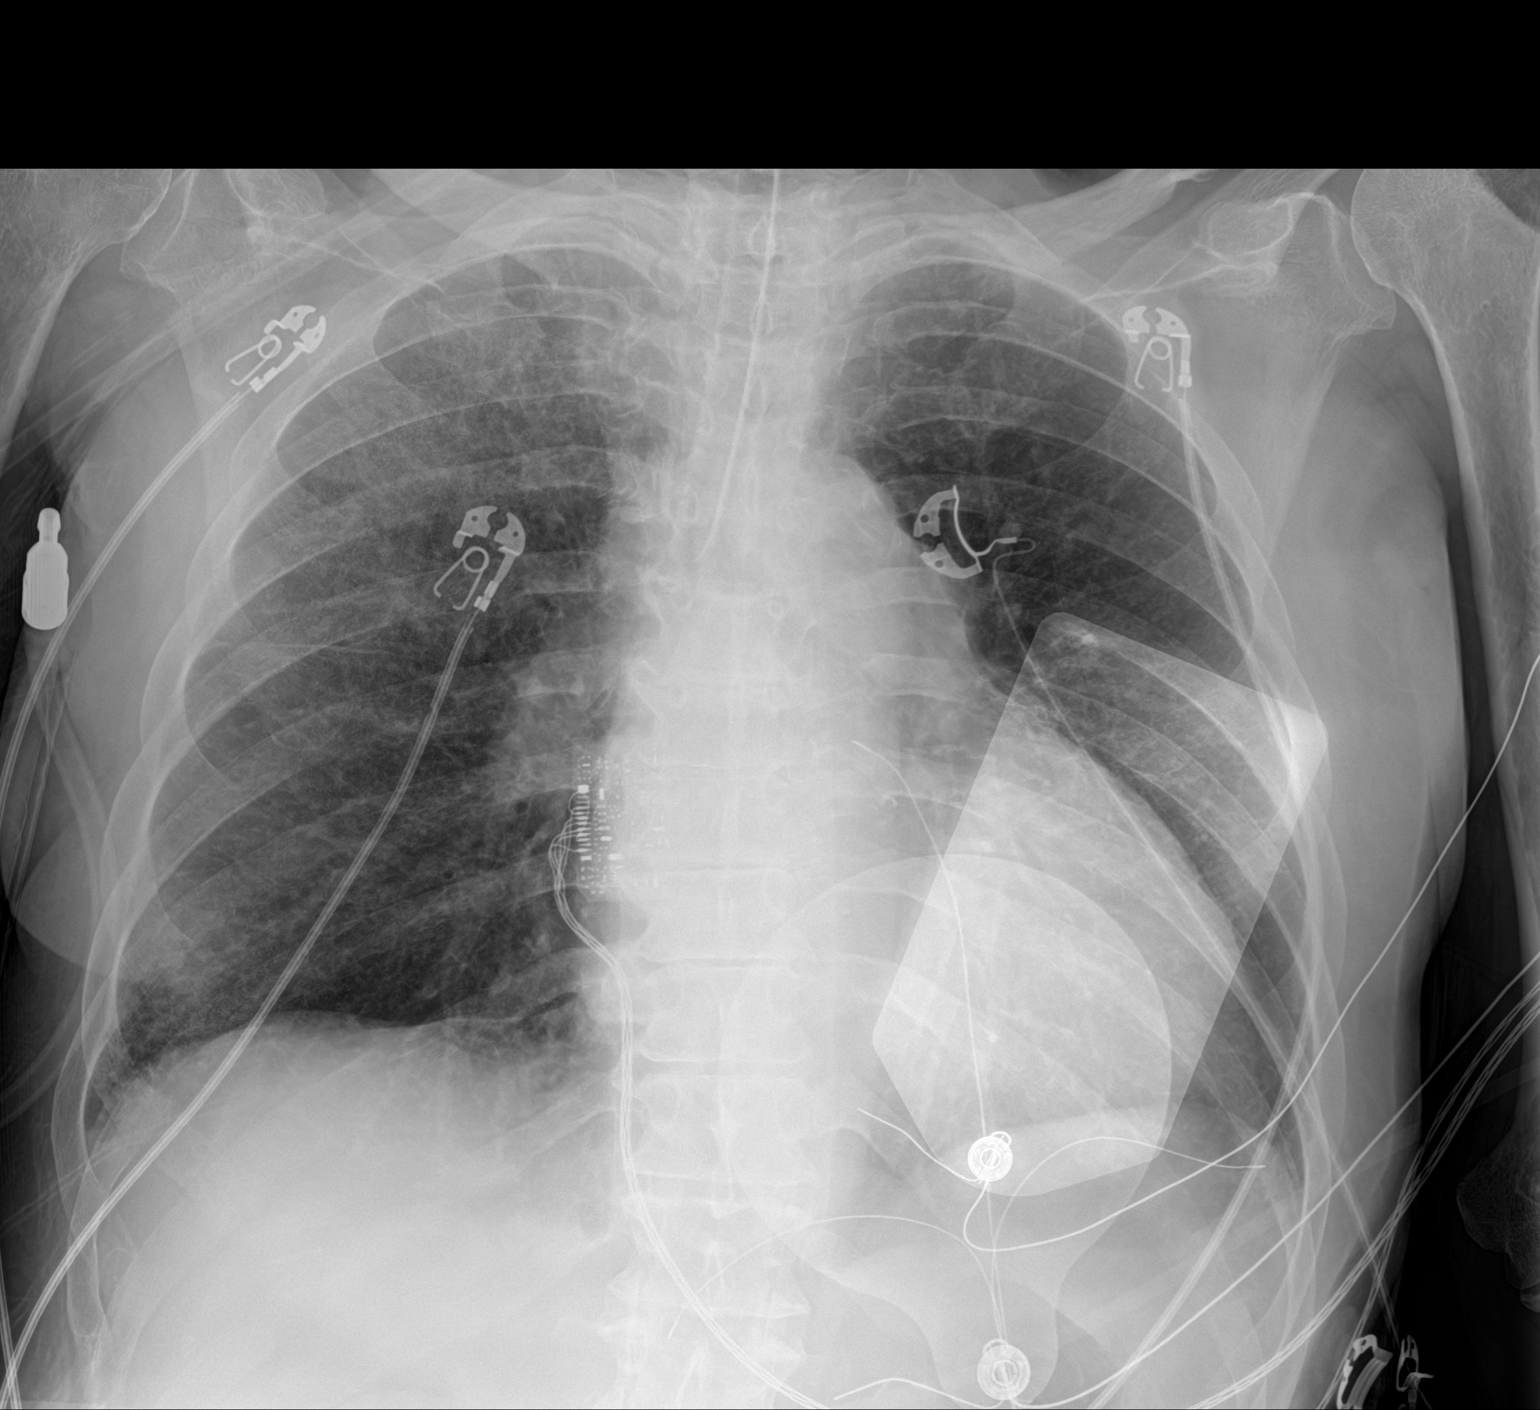

[1 of 1 positions shown; findings below may reference images not displayed]

FINDINGS: Stable cardiomediastinal silhouette. Endotracheal tube has been
partially retracted with distal tip approximately 1 cm above the
carina. Faint right upper lobe airspace opacity is noted. No
pneumothorax or pleural effusion is noted. Bony thorax is
unremarkable.
IMPRESSION: Endotracheal tube has been partially retracted with distal tip
approximately 1 cm above the carina. Faint right upper lobe airspace
opacity is noted concerning for pneumonia. Follow-up radiographs are
recommended.

## 2021-05-08 IMAGING — DX DG CHEST 1V PORT
1 series · 2 of 2 positions shown · non-contrast
Comparison: None.

CLINICAL DATA: Post CPR.  Unresponsive.  HIV positive.

EXAM:
PORTABLE CHEST 1 VIEW

[Series 1: chest · 0.14mm/px · 2 of 2 slices shown]
[im 1/2]
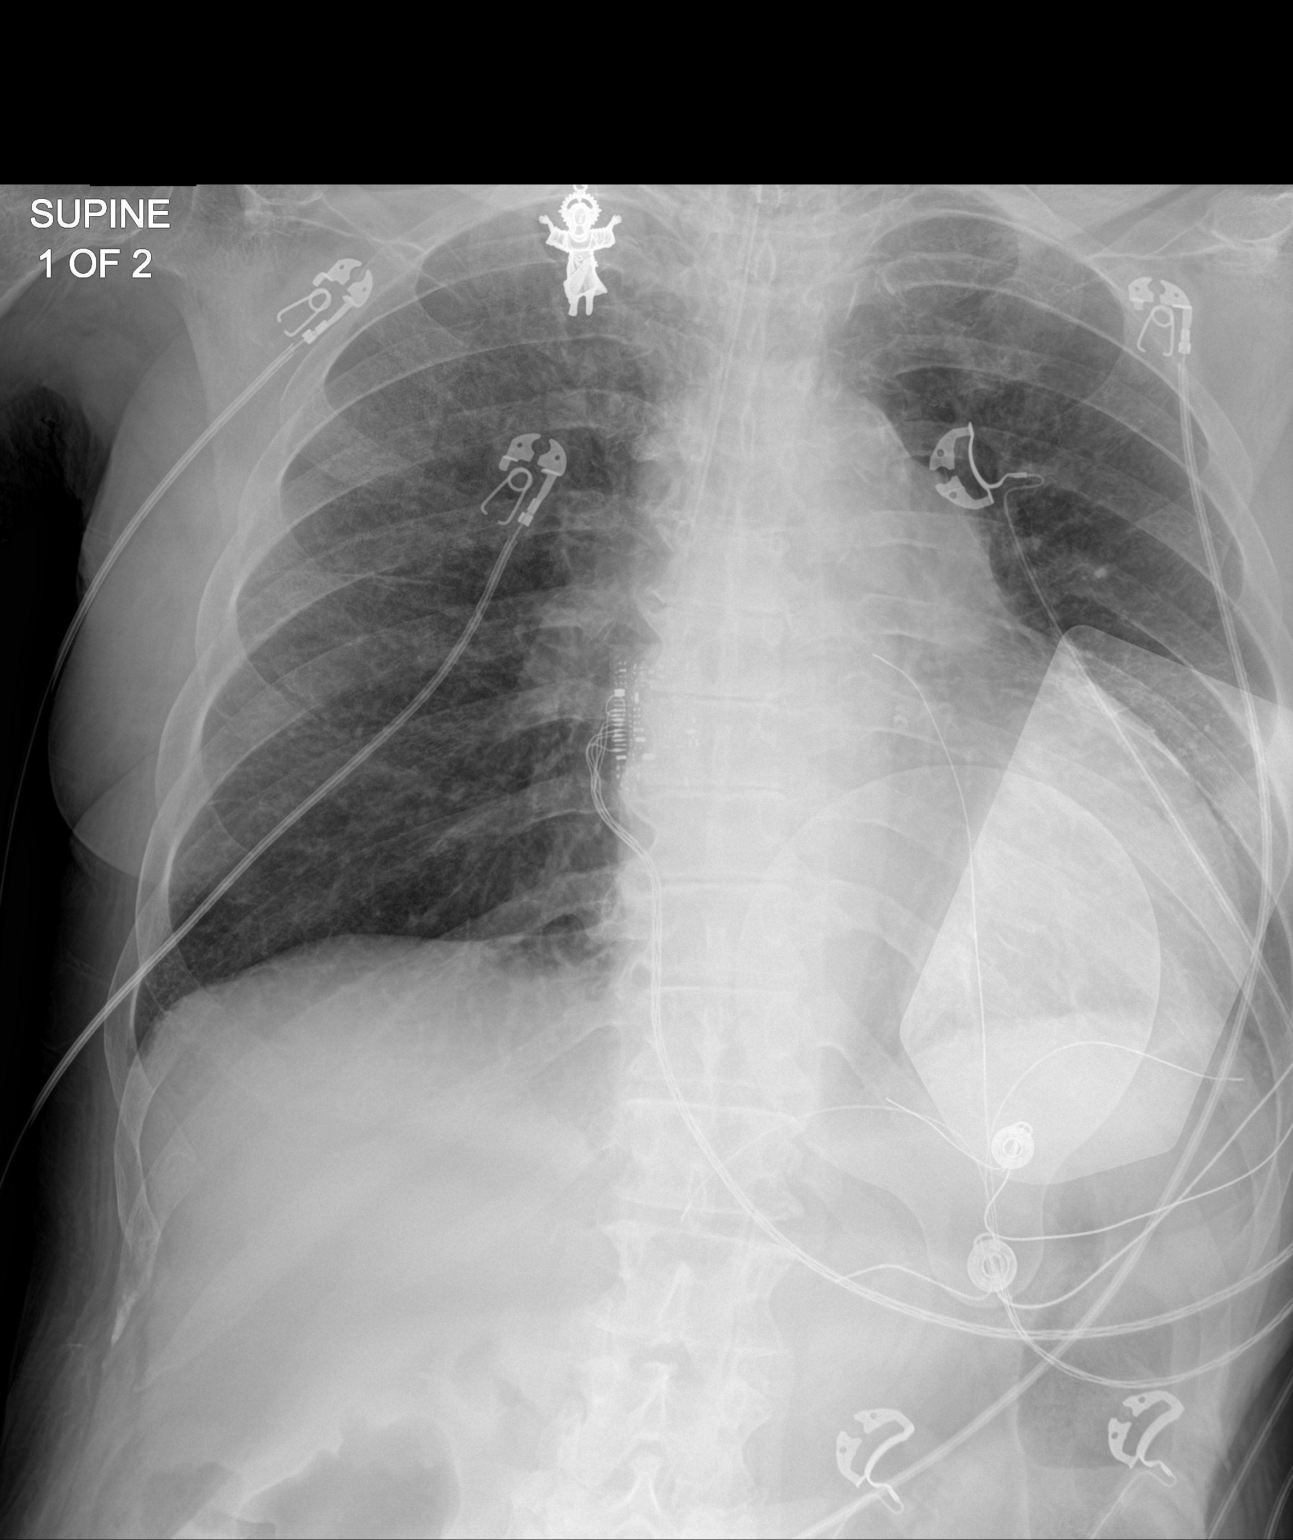
[im 2/2]
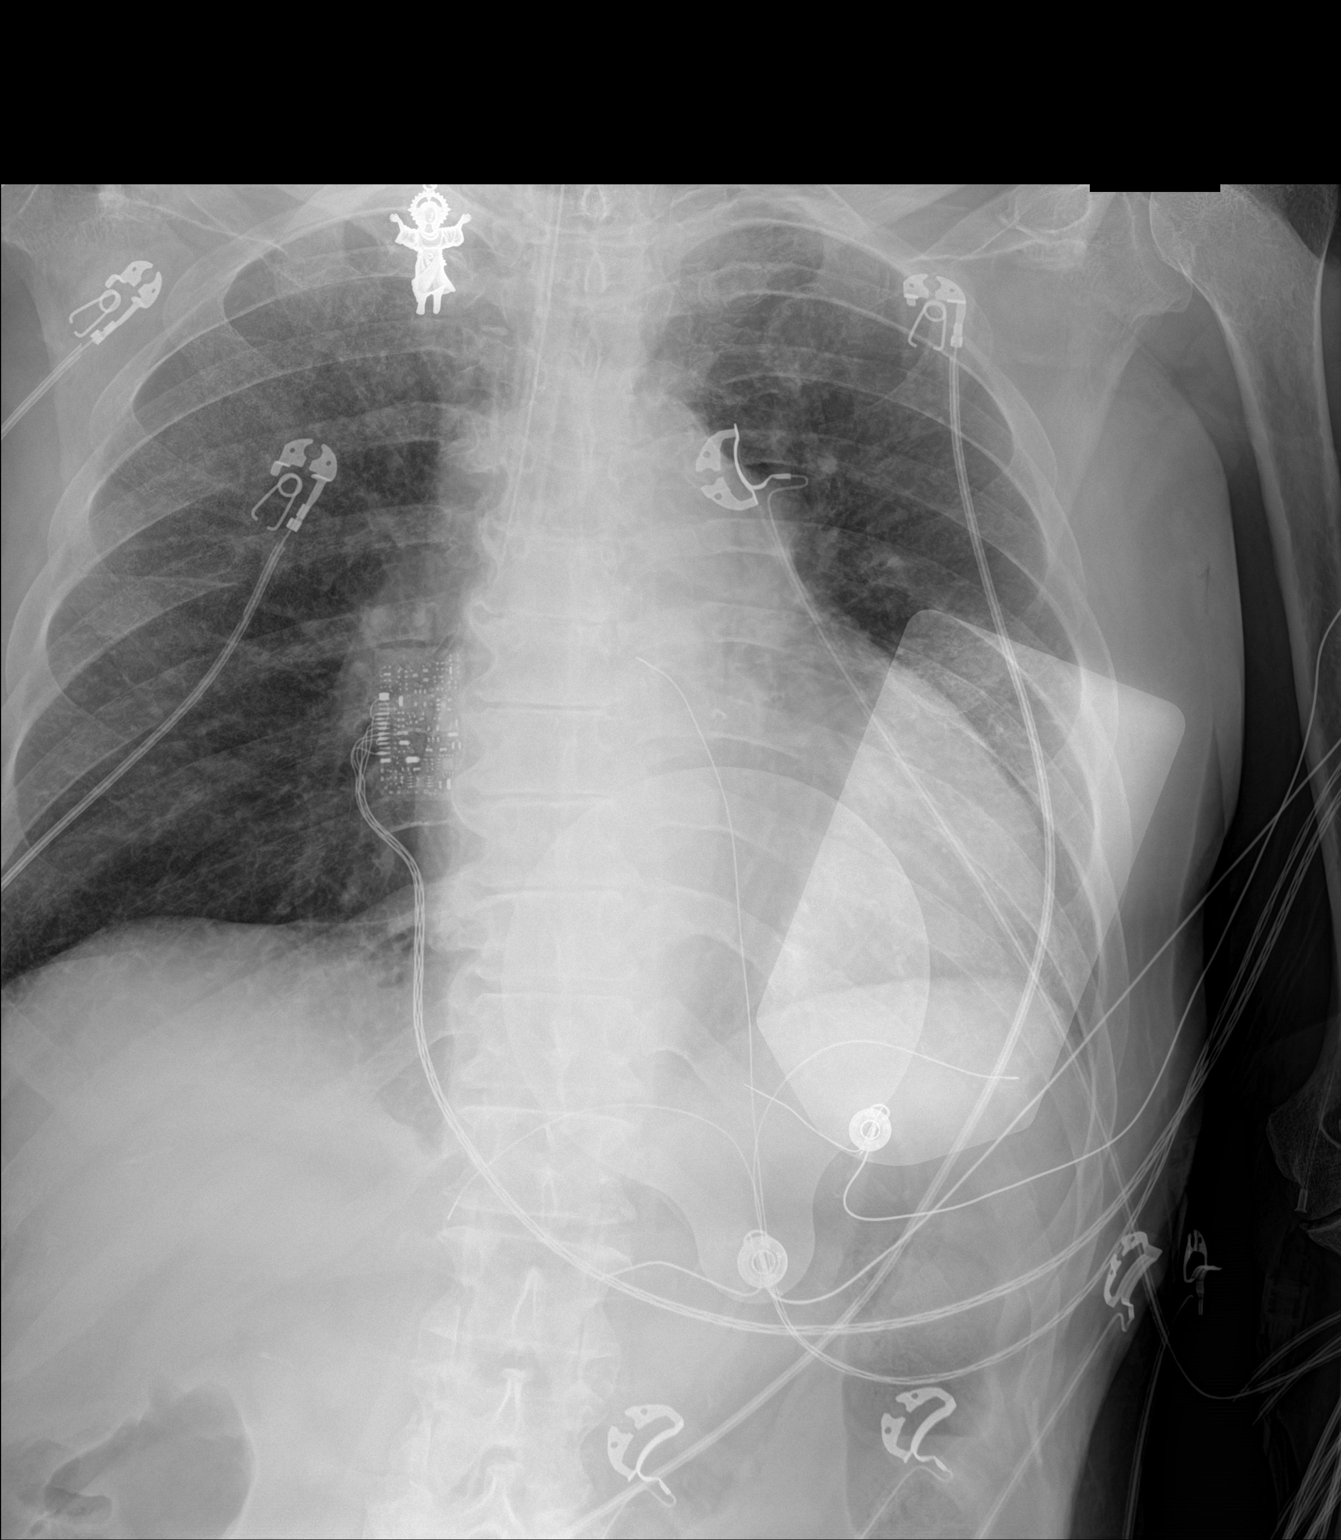

[2 of 2 positions shown; findings below may reference images not displayed]

FINDINGS: Endotracheal tube enters the right mainstem bronchus and should be
retracted approximately 4.0 cm.

External pacer/defibrillator. Borderline cardiomegaly, accentuated
by AP portable technique. No pleural effusion or pneumothorax.
Possible right upper lobe airspace disease. Left lung base not well
evaluated secondary to overlying support apparatus.
IMPRESSION: Endotracheal tube entering right mainstem bronchus. Recommend
retraction 4.0 cm. Finding called to patient's nurse, Macrouma, at [DATE]
a.m.

Possible right upper lobe airspace disease at, as can be seen with
aspiration or pneumonia.
